# Patient Record
Sex: Male | Born: 1995 | Race: Black or African American | Hispanic: No | Marital: Single | Smoking: Current every day smoker
Health system: Southern US, Community
[De-identification: ages and names within clinical notes are randomized; demographics above are authoritative.]

## PROBLEM LIST (undated history)

## (undated) DIAGNOSIS — F259 Schizoaffective disorder, unspecified: Secondary | ICD-10-CM

---

## 2014-01-11 ENCOUNTER — Emergency Department (HOSPITAL_COMMUNITY): Payer: Self-pay

## 2014-01-11 ENCOUNTER — Emergency Department (HOSPITAL_COMMUNITY)
Admission: EM | Admit: 2014-01-11 | Discharge: 2014-01-11 | Disposition: A | Discharge status: Home / Self Care | Payer: Self-pay | Attending: Emergency Medicine | Admitting: Emergency Medicine

## 2014-01-11 ENCOUNTER — Encounter (HOSPITAL_COMMUNITY): Payer: Self-pay | Admitting: Emergency Medicine

## 2014-01-11 DIAGNOSIS — R0789 Other chest pain: Secondary | ICD-10-CM | POA: Insufficient documentation

## 2014-01-11 DIAGNOSIS — F17200 Nicotine dependence, unspecified, uncomplicated: Secondary | ICD-10-CM | POA: Insufficient documentation

## 2014-01-11 DIAGNOSIS — Z8659 Personal history of other mental and behavioral disorders: Secondary | ICD-10-CM | POA: Insufficient documentation

## 2014-01-11 DIAGNOSIS — R079 Chest pain, unspecified: Secondary | ICD-10-CM | POA: Insufficient documentation

## 2014-01-11 DIAGNOSIS — H5789 Other specified disorders of eye and adnexa: Secondary | ICD-10-CM | POA: Insufficient documentation

## 2014-01-11 HISTORY — DX: Schizoaffective disorder, unspecified: F25.9

## 2014-01-11 LAB — CBC WITH DIFFERENTIAL/PLATELET
Basophils Absolute: 0 10*3/uL (ref 0.0–0.1)
Basophils Relative: 0 % (ref 0–1)
Eosinophils Absolute: 0.2 10*3/uL (ref 0.0–0.7)
Eosinophils Relative: 2 % (ref 0–5)
HEMATOCRIT: 42 % (ref 39.0–52.0)
HEMOGLOBIN: 14.4 g/dL (ref 13.0–17.0)
Lymphocytes Relative: 15 % (ref 12–46)
Lymphs Abs: 1.5 10*3/uL (ref 0.7–4.0)
MCH: 27.3 pg (ref 26.0–34.0)
MCHC: 34.3 g/dL (ref 30.0–36.0)
MCV: 79.5 fL (ref 78.0–100.0)
MONO ABS: 0.6 10*3/uL (ref 0.1–1.0)
MONOS PCT: 6 % (ref 3–12)
NEUTROS ABS: 7.6 10*3/uL (ref 1.7–7.7)
Neutrophils Relative %: 77 % (ref 43–77)
Platelets: 191 10*3/uL (ref 150–400)
RBC: 5.28 MIL/uL (ref 4.22–5.81)
RDW: 14.3 % (ref 11.5–15.5)
WBC: 9.8 10*3/uL (ref 4.0–10.5)

## 2014-01-11 LAB — BASIC METABOLIC PANEL
Anion gap: 10 (ref 5–15)
BUN: 10 mg/dL (ref 6–23)
CO2: 27 meq/L (ref 19–32)
Calcium: 9 mg/dL (ref 8.4–10.5)
Chloride: 103 mEq/L (ref 96–112)
Creatinine, Ser: 0.78 mg/dL (ref 0.50–1.35)
GFR calc Af Amer: >90 mL/min (ref >90)
GLUCOSE: 93 mg/dL (ref 70–99)
POTASSIUM: 4.5 meq/L (ref 3.7–5.3)
Sodium: 140 mEq/L (ref 137–147)

## 2014-01-11 LAB — TROPONIN I: Troponin I: <0.3 ng/mL (ref <0.30)

## 2014-01-11 LAB — D-DIMER, QUANTITATIVE: D-Dimer, Quant: <0.27 ug/mL-FEU (ref 0.00–0.48)

## 2014-01-11 NOTE — ED Provider Notes (Signed)
CSN: [id number]     Arrival date & time 01/11/14  8657 History   First MD Initiated Contact with Patient 01/11/14 0847     Chief Complaint  Patient presents with  . Chest Pain  . Eye Drainage   (Consider location/radiation/quality/duration/timing/severity/associated sxs/prior Treatment) HPI Bobby Leon is a 18 yo male with PMH: schizoaffective disorder, presenting to the ED with report of an episode of chest pain appr 1 hr prior to arrival.  Pt states he was praying with co-workers before the beginning of his shift and felt dull ache in his anterior chest wall, it lasted about 10 min and resolved on its own. He rates the pain at the time as 8/10,  currently the pain is 2/10 and describes it as intermittent.  Nothing really makes the pain better or worse. He also states at the time he had some blurry vision, that resolved when the pain resolved.  Nothing seems to make the pain better or worse.  He endorse a long bus ride in the last 2 weeks, but denies recent surgery, pain or unilateral swelling in an extremity, cough, shortness of breath, headache, numbness, tingling or fever.    Past Medical History  Diagnosis Date  . Schizoaffective disorder    History reviewed. No pertinent past surgical history. No family history on file. History  Substance Use Topics  . Smoking status: Current Every Day Smoker  . Smokeless tobacco: Not on file  . Alcohol Use: No    Review of Systems  Constitutional: Negative for fever and chills.  HENT: Negative for sore throat.   Eyes: Positive for discharge. Negative for visual disturbance.  Respiratory: Negative for cough and shortness of breath.   Cardiovascular: Positive for chest pain. Negative for leg swelling.  Gastrointestinal: Negative for nausea, vomiting and diarrhea.  Genitourinary: Negative for dysuria.  Musculoskeletal: Negative for myalgias.  Skin: Negative for rash.  Neurological: Negative for weakness, numbness and headaches.     Allergies  Review of patient's allergies indicates not on file.  Home Medications   Prior to Admission medications   Not on File   BP 110/57  Pulse 66  Temp(Src) 97.4 F (36.3 C) (Oral)  Resp 16  Ht  (1.854 m)  Wt 197 lb (89.359 kg)  BMI 26.00 kg/m2  SpO2 100% Physical Exam  Nursing note and vitals reviewed. Constitutional: He appears well-developed and well-nourished. No distress.  HENT:  Head: Normocephalic and atraumatic.  Mouth/Throat: Oropharynx is clear and moist. No oropharyngeal exudate.  Eyes: Conjunctivae are normal.  Neck: Neck supple. No thyromegaly present.  Cardiovascular: Normal rate, regular rhythm and intact distal pulses.   Pulmonary/Chest: Effort normal and breath sounds normal. No respiratory distress. He has no wheezes. He has no rales. He exhibits no tenderness.  Abdominal: Soft. There is no tenderness.  Musculoskeletal: He exhibits no tenderness.  Lymphadenopathy:    He has no cervical adenopathy.  Neurological: He is alert.  Skin: Skin is warm and dry. No rash noted. He is not diaphoretic.  Psychiatric: He has a normal mood and affect.    ED Course  Procedures (including critical care time) Labs Review Labs Reviewed  CBC WITH DIFFERENTIAL  TROPONIN I  BASIC METABOLIC PANEL  D-DIMER, QUANTITATIVE  TROPONIN I    Imaging Review Dg Chest 2 View  01/11/2014   CLINICAL DATA:  Chest pain and shortness of breath.  EXAM: CHEST  2 VIEW  COMPARISON:  None.  FINDINGS: Cardiac leads project over the chest. The heart,  mediastinal, and hilar contours are normal. Pulmonary vascularity is normal. Lungs clear. Negative for pleural effusion or pneumothorax. Trachea is midline. Bones are intact.  IMPRESSION: No active cardiopulmonary disease.   Electronically Signed   By: Britta Mccreedy M.D.   On: 01/11/2014 11:49     EKG Interpretation   Date/Time:  Saturday January 11 2014 08:41:27 EDT Ventricular Rate:  69 PR Interval:  140 QRS Duration:  96 QT Interval:  394 QTC Calculation: 422 R Axis:   67 Text Interpretation:  Normal sinus rhythm Cannot rule out Anterior infarct  , age undetermined Abnormal ECG No significant change was found Confirmed  by Manus Gunning  MD, STEPHEN [id number]) on 01/11/2014 11:43:26 AM      MDM   Final diagnoses:  Chest pain, unspecified chest pain type   Episode of chest pain while praying, no radiation, no n/v sob. Also blurry vision during episode.  All symptoms now resolved.  Labs, CXR and visual acuity.  Visual acuity equal and normal.  PERC positive due to report of long bus ride 2 weeks ago.  CBC, BMP, Troponin and repeat Troponin, and D-dimer negative.  Pt sleeping without distress, no subsequent episodes of chest pain while in the department.  Pt appears safe to go home. Discharge instructions include symptomatic management, encouragement to follow up with PCP, and resources to establish a PCP.  Return precautions provided.    Filed Vitals:   01/11/14 1202 01/11/14 1245 01/11/14 1330 01/11/14 1405  BP: 108/55 117/50 121/58 121/58  Pulse: 61 59 65 74  Temp:    97.8 F (36.6 C)  TempSrc:    Oral  Resp: Height:      Weight:      SpO2: 99% 100% 100% 100%    Meds given in ED:  Medications - No data to display  Discharge Medication List as of 01/11/2014  1:55 PM        Harle Battiest, NP 01/14/14 1014

## 2014-01-11 NOTE — ED Notes (Signed)
Pt. Stated, i had a episode where my chest was hurting and felt weak and my eye is draining like i can't see

## 2014-01-11 NOTE — ED Notes (Signed)
Patient returned from X-ray 

## 2014-01-11 NOTE — Discharge Instructions (Signed)
Please follow the directions provided.  Be sure to follow up with your primary care provider regarding this episode of pain.    Your caregiver has diagnosed you as having chest pain that is not specific for one problem, but does not require admission.  You are at low risk for an acute heart condition or other serious illness. Chest pain comes from many different causes.   SEEK IMMEDIATE MEDICAL ATTENTION IF: You have severe chest pain, especially if the pain is crushing or pressure-like and spreads to the arms, back, neck, or jaw, or if you have sweating, nausea (feeling sick to your stomach), or shortness of breath. THIS IS AN EMERGENCY. Don't wait to see if the pain will go away. Get medical help at once. Call 911 or 0 (operator). DO NOT drive yourself to the hospital.  Your chest pain gets worse and does not go away with rest.  You have an attack of chest pain lasting longer than usual, despite rest and treatment with the medications your caregiver has prescribed.  You wake from sleep with chest pain or shortness of breath.  You feel dizzy or faint.  You have chest pain not typical of your usual pain for which you originally saw your caregiver.   Emergency Department Resource Guide 1) Find a Doctor and Pay Out of Pocket Although you won't have to find out who is covered by your insurance plan, it is a good idea to ask around and get recommendations. You will then need to call the office and see if the doctor you have chosen will accept you as a new patient and what types of options they offer for patients who are self-pay. Some doctors offer discounts or will set up payment plans for their patients who do not have insurance, but you will need to ask so you aren't surprised when you get to your appointment.  2) Contact Your Local Health Department Not all health departments have doctors that can see patients for sick visits, but many do, so it is worth a call to see if yours does. If you don't  know where your local health department is, you can check in your phone book. The CDC also has a tool to help you locate your state's health department, and many state websites also have listings of all of their local health departments.  3) Find a Walk-in Clinic If your illness is not likely to be very severe or complicated, you may want to try a walk in clinic. These are popping up all over the country in pharmacies, drugstores, and shopping centers. They're usually staffed by nurse practitioners or physician assistants that have been trained to treat common illnesses and complaints. They're usually fairly quick and inexpensive. However, if you have serious medical issues or chronic medical problems, these are probably not your best option.  No Primary Care Doctor: - Call Health Connect at  [phone] - they can help you locate a primary care doctor that  accepts your insurance, provides certain services, etc. - Physician Referral Service- [phone]  Chronic Pain Problems: Organization         Address  Phone   Notes  [facility]  [phone] Patients need to be referred by their primary care doctor.   Medication Assistance: Organization         Address  Phone   Notes  [facility] Medication [facility] [address] [place] [place], [place] [address] [phone] --Must be a  resident of [facility] -- Must have NO insurance coverage whatsoever (no Medicaid/ Medicare, etc.) -- The pt. MUST have a primary care doctor that directs their care regularly and follows them in the community   [facility]  [phone]   [employer]  [phone]    Agencies that provide inexpensive medical care: Organization         Address  Phone   Notes  [facility]  [phone]   [facility]    [phone]   [facility] [address] [place], [place] [address] [phone]   [facility] [address] [place], [place] [phone]   Planned Parenthood    [phone]   [facility]    [phone]   [facility]  [address], [place] Phone:  [phone], Fax:  [phone] Hours of Operation:  9 am - 6 pm, M-F.  Also accepts Medicaid/Medicare and self-pay.  [facility]  [address], [place] Phone: [phone], Fax: [phone]. Hours of Operation:  8:30 am - 5:30 pm, M-F.  Also accepts Medicaid and self-pay.  [facility] [place] [address], [place] Phone: [phone]   [facility] [address] Natasha Bence [place], [place] [phone], Ext. [phone] Mondays & Thursdays: 7-9 AM.  First 15 patients are seen on a first come, first serve basis.    Medicaid-accepting [facility] Providers:  Organization         Address  Phone   Notes  [facility] [address], [place] [phone] Also accepts self-pay patients.  [facility] [address], [place]  [phone]   [facility] [address], [place] [phone]   [facility] [address], [place] [phone]   Renaye Rakers [address], [place]   [phone] Only accepts [place] Access [place] patients after they have their name applied to their card.   Self-Pay (no insurance) in [facility]:  Organization         Address  Phone   Notes  Sickle Cell Patients, [facility] [address] [place], [place] [phone]   [facility] Urgent Care [address] [place], [place] [phone]   [facility] [place]  [address], [place] [phone]   [facility]/Dr. Osei-Bonsu  [address] [place] or [address], [place] [phone] Phone  number for both [place] and [place] locations is the same.  [facility] [address] [place] [phone]   [facility] [address], [place] or [address] [phone]   [facility] [address] [place] [phone], phone; [phone], fax Sees patients 1st and 3rd Saturday of every month.  Must not qualify for public or private insurance (i.e. Medicaid, Medicare, [place] Health Choice, Veterans' Benefits)  Household income should be no more than 200% of the poverty level The clinic cannot treat you if you are pregnant or think you are pregnant  Sexually transmitted diseases are not treated at the clinic.    Dental Care: Organization  Address  Phone  Notes  [facility] [address] [place], [place] [phone] Accepts children up to age 4 who are enrolled in [place] or [place] Health Choice; pregnant women with a Medicaid card; and children who have applied for Medicaid or [place] Health Choice, but were declined, whose parents can pay a reduced fee at time of service.  [facility]  [address], [place] [phone] Accepts children up to age 7 who are enrolled in [place] or [place] Health Choice; pregnant women with a Medicaid card; and children who have applied for Medicaid or [place] Health Choice, but were declined, whose parents can pay a reduced fee at time of service.  [facility]  [address] [place], [place] [phone] Patients are seen by appointment only. Walk-ins are not accepted. [facility] will see patients 110 years of age and older. Monday - Tuesday (8am-5pm) Most Wednesdays (8:30-5pm) $30 per visit, cash only  [facility]  [address], [facility] [phone] Patients are seen by appointment only.  Walk-ins are not accepted. [facility] will see patients 78 years of age and older. One Wednesday Evening (Monthly: Volunteer Based).  $30 per visit, cash only  [employer]  [phone] for adults; Children under age 51, call Graduate Pediatric Dentistry at [phone]. Children aged 36-14, please call [phone] to request a pediatric application.  Dental services are provided in all areas of dental care including fillings, crowns and bridges, complete and partial dentures, implants, gum treatment, root canals, and extractions. Preventive care is also provided. Treatment is provided to both adults and children. Patients are selected via a lottery and there is often a waiting list.   [facility] [address] [place]  [phone] [email or web address]   [facility] [address] [place], [place] [phone], Ext. [phone] Second and Fourth Thursday of each month, opens at 6:30 AM; Clinic ends at 9 AM.  Patients are seen on a first-come first-served basis, and a limited number are seen during each clinic.   [facility]  [address] [place], [place] [phone]   Eligibility Requirements You must have lived in [place], [place], or [place] counties for at least the last three months.   You cannot be eligible for state or federal sponsored [place], including [employer], [place], or [employer].   You generally cannot be eligible for healthcare insurance through your employer.    How to apply: Eligibility screenings are held every Tuesday and Wednesday afternoon from 1:00 pm until 4:00 pm. You do not need an appointment for the interview!  [facility] [address] [place], [place] [phone]   [facility] Health Department  [phone]   [facility] Health Department  [phone]   [facility] Health Department  [phone]    Behavioral Health  Resources in the Community: Intensive Outpatient Programs Organization         Address  Phone  Notes  [facility] [address] [place], [place] [phone]   [facility] Outpatient [address] [place], [place] [phone]   [facility] [address] [place], [place]  [phone]   [facility] Mental Health [address]  [place], [place] [phone] or [phone]   Substance Abuse Resources  Organization         Address  Phone  Notes  [facility]  [phone]   [facility]  [phone]   [facility] [place]  [phone]   Floydene Flock  [phone]   Residential & Outpatient Substance Abuse Program  [phone]   Psychological Services Organization         Address  Phone  Notes  [facility]  [phone]   [facility]  [phone]   [facility] Mental Health [address], [place] [phone] or [phone]    Mobile Crisis Teams Organization         Address  Phone  Notes  [facility], Mobile Crisis Care Unit  [phone]   [facility]  [address] [place], [place] [phone]   Doristine Locks [address] [place] [phone]    Self-Help/Support Groups Organization         Address  Phone             Notes  [facility] [place] - variety of support groups  336- I7437963 Call for more information  Narcotics Anonymous (NA), [facility] [address], [employer] [place]  2 meetings at this location   Statistician         Address  Phone  Notes  [facility] [address],    [place]  [phone]   [facility]  [address] [place] [address], [place], [place] [phone]   [facility] [address] [place], [place] [phone] Admissions: 8am-3pm M-F  [facility] [address],    [place], [place] [phone]   [facility] [address] [place], [place], [place] [phone]   [facility] [address]  [place], [place] [phone]   [facility] - Intensive Outpatient [address], [place], [place] [phone]   [facility] ([facility] [address] [place]  [place], [place] [phone] or [phone]   [facility] ([facility]) [address], [place], [place] [phone] Accepts Medicaid  [facility] [address]  [place] [phone] Substance Abuse/Addiction Treatment   [facility] Organization         Address  Phone  Notes  [facility]  [phone]   Angie Fava, PhD [address] Ervin Knack [place], [place]   [phone] or [phone]   [facility]al   [address] [place], [place] [phone]   [facility] [address] [place], [place] [phone] Insurance/Medicaid/sponsorship through [facility] and Families [address]                                    [place], [place] [phone] Therapy/tele-psych/case  [facility] [address], [place] [phone]    Dr. Lolly Mustache  [phone]   [facility] [place]  [employer] [facility] Dept. 1) [address], [place] 2) [address], [place] 3)  [address], [place] [phone]  [phone]   [facility] Child Abuse Hotline [phone] or [phone] (After Hours)

## 2014-01-11 NOTE — ED Notes (Signed)
Patient transported to X-ray 

## 2014-01-14 NOTE — ED Provider Notes (Signed)
Medical screening examination/treatment/procedure(s) were conducted as a shared visit with non-physician practitioner(s) and myself.  I personally evaluated the patient during the encounter.  Conflicting history. Denies CP to me. States had SOB 1 hour ago, now resolved.  Denies any visual changes to me.  CTAB, RRR. EKG nsr. Exam benign.  D-dimer given recent bus trip.   EKG Interpretation   Date/Time:  Saturday January 11 2014 08:41:27 EDT Ventricular Rate:  69 PR Interval:  140 QRS Duration: 96 QT Interval:  394 QTC Calculation: 422 R Axis:   67 Text Interpretation:  Normal sinus rhythm Cannot rule out Anterior infarct  , age undetermined Abnormal ECG No significant change was found Confirmed  by Manus Gunning  MD, Tempie Gibeault [phone]) on 01/11/2014 11:43:26 AM       Glynn Octave, MD 01/14/14 1104

## 2014-02-26 ENCOUNTER — Encounter (HOSPITAL_COMMUNITY): Payer: Self-pay | Admitting: Emergency Medicine

## 2014-02-26 ENCOUNTER — Emergency Department (HOSPITAL_COMMUNITY): Payer: Self-pay

## 2014-02-26 ENCOUNTER — Emergency Department (HOSPITAL_COMMUNITY)
Admission: EM | Admit: 2014-02-26 | Discharge: 2014-02-27 | Disposition: A | Discharge status: Home / Self Care | Payer: Self-pay | Attending: Emergency Medicine | Admitting: Emergency Medicine

## 2014-02-26 DIAGNOSIS — F1910 Other psychoactive substance abuse, uncomplicated: Secondary | ICD-10-CM

## 2014-02-26 DIAGNOSIS — T483X1A Poisoning by antitussives, accidental (unintentional), initial encounter: Secondary | ICD-10-CM

## 2014-02-26 DIAGNOSIS — R4182 Altered mental status, unspecified: Secondary | ICD-10-CM | POA: Insufficient documentation

## 2014-02-26 DIAGNOSIS — Z720 Tobacco use: Secondary | ICD-10-CM | POA: Insufficient documentation

## 2014-02-26 LAB — ETHANOL: Alcohol, Ethyl (B): <11 mg/dL (ref 0–11)

## 2014-02-26 LAB — CBC WITH DIFFERENTIAL/PLATELET
Basophils Absolute: 0 10*3/uL (ref 0.0–0.1)
Basophils Relative: 0 % (ref 0–1)
EOS ABS: 0 10*3/uL (ref 0.0–0.7)
Eosinophils Relative: 0 % (ref 0–5)
HEMATOCRIT: 44.6 % (ref 39.0–52.0)
Hemoglobin: 15 g/dL (ref 13.0–17.0)
LYMPHS ABS: 1.1 10*3/uL (ref 0.7–4.0)
LYMPHS PCT: 6 % — AB (ref 12–46)
MCH: 27.2 pg (ref 26.0–34.0)
MCHC: 33.6 g/dL (ref 30.0–36.0)
MCV: 80.8 fL (ref 78.0–100.0)
MONOS PCT: 6 % (ref 3–12)
Monocytes Absolute: 1.2 10*3/uL — ABNORMAL HIGH (ref 0.1–1.0)
Neutro Abs: 16.7 10*3/uL — ABNORMAL HIGH (ref 1.7–7.7)
Neutrophils Relative %: 88 % — ABNORMAL HIGH (ref 43–77)
Platelets: 220 10*3/uL (ref 150–400)
RBC: 5.52 MIL/uL (ref 4.22–5.81)
RDW: 13.7 % (ref 11.5–15.5)
WBC: 19 10*3/uL — ABNORMAL HIGH (ref 4.0–10.5)

## 2014-02-26 LAB — COMPREHENSIVE METABOLIC PANEL
ALT: 47 U/L (ref 0–53)
ANION GAP: 12 (ref 5–15)
AST: 67 U/L — AB (ref 0–37)
Albumin: 4.5 g/dL (ref 3.5–5.2)
Alkaline Phosphatase: 70 U/L (ref 39–117)
BUN: 11 mg/dL (ref 6–23)
CO2: 26 meq/L (ref 19–32)
CREATININE: 1.07 mg/dL (ref 0.50–1.35)
Calcium: 9.8 mg/dL (ref 8.4–10.5)
Chloride: 98 mEq/L (ref 96–112)
GFR calc Af Amer: >90 mL/min (ref >90)
Glucose, Bld: 114 mg/dL — ABNORMAL HIGH (ref 70–99)
POTASSIUM: 5 meq/L (ref 3.7–5.3)
Sodium: 136 mEq/L — ABNORMAL LOW (ref 137–147)
Total Bilirubin: 0.9 mg/dL (ref 0.3–1.2)
Total Protein: 7.9 g/dL (ref 6.0–8.3)

## 2014-02-26 LAB — RAPID URINE DRUG SCREEN, HOSP PERFORMED
AMPHETAMINES: NOT DETECTED
BENZODIAZEPINES: NOT DETECTED
Barbiturates: NOT DETECTED
COCAINE: NOT DETECTED
Opiates: NOT DETECTED
Tetrahydrocannabinol: NOT DETECTED

## 2014-02-26 LAB — PROTIME-INR
INR: 1.11 (ref 0.00–1.49)
Prothrombin Time: 14.4 seconds (ref 11.6–15.2)

## 2014-02-26 LAB — ACETAMINOPHEN LEVEL: Acetaminophen (Tylenol), Serum: <15 ug/mL (ref 10–30)

## 2014-02-26 LAB — SALICYLATE LEVEL: Salicylate Lvl: <2 mg/dL — ABNORMAL LOW (ref 2.8–20.0)

## 2014-02-26 MED ORDER — NICOTINE 21 MG/24HR TD PT24: 21.0000 mg | MEDICATED_PATCH | Freq: Every day | TRANSDERMAL | Status: DC | PRN

## 2014-02-26 MED ORDER — ALUM & MAG HYDROXIDE-SIMETH 200-200-20 MG/5ML PO SUSP: 30.0000 mL | ORAL | Status: DC | PRN

## 2014-02-26 MED ORDER — ACETAMINOPHEN 325 MG PO TABS: 650.0000 mg | ORAL_TABLET | ORAL | Status: DC | PRN

## 2014-02-26 MED ORDER — SODIUM CHLORIDE 0.9 % IV SOLN: INTRAVENOUS | Status: DC

## 2014-02-26 MED ORDER — SODIUM CHLORIDE 0.9 % IV BOLUS (SEPSIS)
1000.0000 mL | Freq: Once | INTRAVENOUS | Status: AC
  Administered 2014-02-26: 1000 mL via INTRAVENOUS

## 2014-02-26 MED ORDER — IBUPROFEN 200 MG PO TABS: 400.0000 mg | ORAL_TABLET | Freq: Three times a day (TID) | ORAL | Status: DC | PRN

## 2014-02-26 NOTE — ED Notes (Signed)
Patient transported to X-ray 

## 2014-02-26 NOTE — ED Notes (Signed)
Pt reports that he took 3 bottles of delsym and one pack of cough and cold congestion medication. Pt reports that he does not know how many pills were in each bottle. Pt denies SI/HI but says that he did this because he just wanted to feel something.
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
## 2014-02-26 NOTE — ED Notes (Signed)
Bed: WA22 Expected date:  Expected time:  Means of arrival:  Comments: 

## 2014-02-26 NOTE — ED Notes (Signed)
Pt's friend/family contact info   [facility] - [phone] Gildardo CrankerWillie Wooten- [phone]

## 2014-02-26 NOTE — ED Notes (Signed)
Pt given urinal and made aware of need for urine specimen 
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
## 2014-02-27 ENCOUNTER — Encounter (HOSPITAL_COMMUNITY): Payer: Self-pay | Admitting: Registered Nurse

## 2014-02-27 DIAGNOSIS — F1994 Other psychoactive substance use, unspecified with psychoactive substance-induced mood disorder: Secondary | ICD-10-CM

## 2014-02-27 DIAGNOSIS — T483X1A Poisoning by antitussives, accidental (unintentional), initial encounter: Secondary | ICD-10-CM | POA: Diagnosis present

## 2014-02-27 NOTE — ED Notes (Signed)
Patient refused vitals.

## 2014-02-27 NOTE — ED Notes (Signed)
Patient has calm and cooperative during the shift. Patient has interacted with peers and staff appropriately. Encouragement and support provided and safety maintain.
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
## 2014-02-27 NOTE — Consult Note (Signed)
Face to face evaluation and I agree with this note 

## 2014-02-27 NOTE — BH Assessment (Signed)
Dr. Ladona Ridgelaylor and Assunta FoundShuvon Rankin, NP evaluated patient this am and recommended discharge. Patient to discharge back to his current residence [facility] Writer confirmed with Interior and spatial designerdirector [facility]Darryl" that patient is ok to return. Dar[facility] Writer made nursing staff aware of discharge plans.

## 2014-02-27 NOTE — Discharge Instructions (Signed)
Chemical Dependency  Chemical dependency is an addiction to drugs or alcohol. It is characterized by the repeated behavior of seeking out and using drugs and alcohol despite harmful consequences to the health and safety of ones self and others.   RISK FACTORS  There are certain situations or behaviors that increase a person's risk for chemical dependency. These include:  · A family history of chemical dependency.  · A history of mental health issues, including depression and anxiety.  · A home environment where drugs and alcohol are easily available to you.  · Drug or alcohol use at a young age.  SYMPTOMS   The following symptoms can indicate chemical dependency:  · Inability to limit the use of drugs or alcohol.  · Nausea, sweating, shakiness, and anxiety that occurs when alcohol or drugs are not being used.  · An increase in amount of drugs or alcohol that is necessary to get drunk or high.  People who experience these symptoms can assess their use of drugs and alcohol by asking themselves the following questions:  · Have you been told by friends or family that they are worried about your use of alcohol or drugs?  · Do friends and family ever tell you about things you did while drinking alcohol or using drugs that you do not remember?  · Do you lie about using alcohol or drugs or about the amounts you use?  · Do you have difficulty completing daily tasks unless you use alcohol or drugs?  · Is the level of your work or school performance lower because of your drug or alcohol use?  · Do you get sick from using drugs or alcohol but keep using anyway?  · Do you feel uncomfortable in social situations unless you use alcohol or drugs?  · Do you use drugs or alcohol to help forget problems?   An answer of yes to any of these questions may indicate chemical dependency. Professional evaluation is suggested.  Document Released: 04/12/2001 Document Revised: 07/11/2011 Document Reviewed: 06/24/2010  ExitCare® Patient  Information ©2015 [employer]. This information is not intended to replace advice given to you by your health care provider. Make sure you discuss any questions you have with your health care provider.

## 2014-02-27 NOTE — Progress Notes (Signed)
[facility] Stacy,  ° °Provided pt with a list of primary care resources and a [facility] Orange Card application to help patient establish primary care.  °

## 2015-07-02 IMAGING — CR DG CHEST 2V
2 series · 2 of 2 positions shown · non-contrast
Comparison: 01/11/2014

CLINICAL DATA: Nonsmoker, cough, congestion, shortness of breath

EXAM:
CHEST  2 VIEW

[w chest pa]
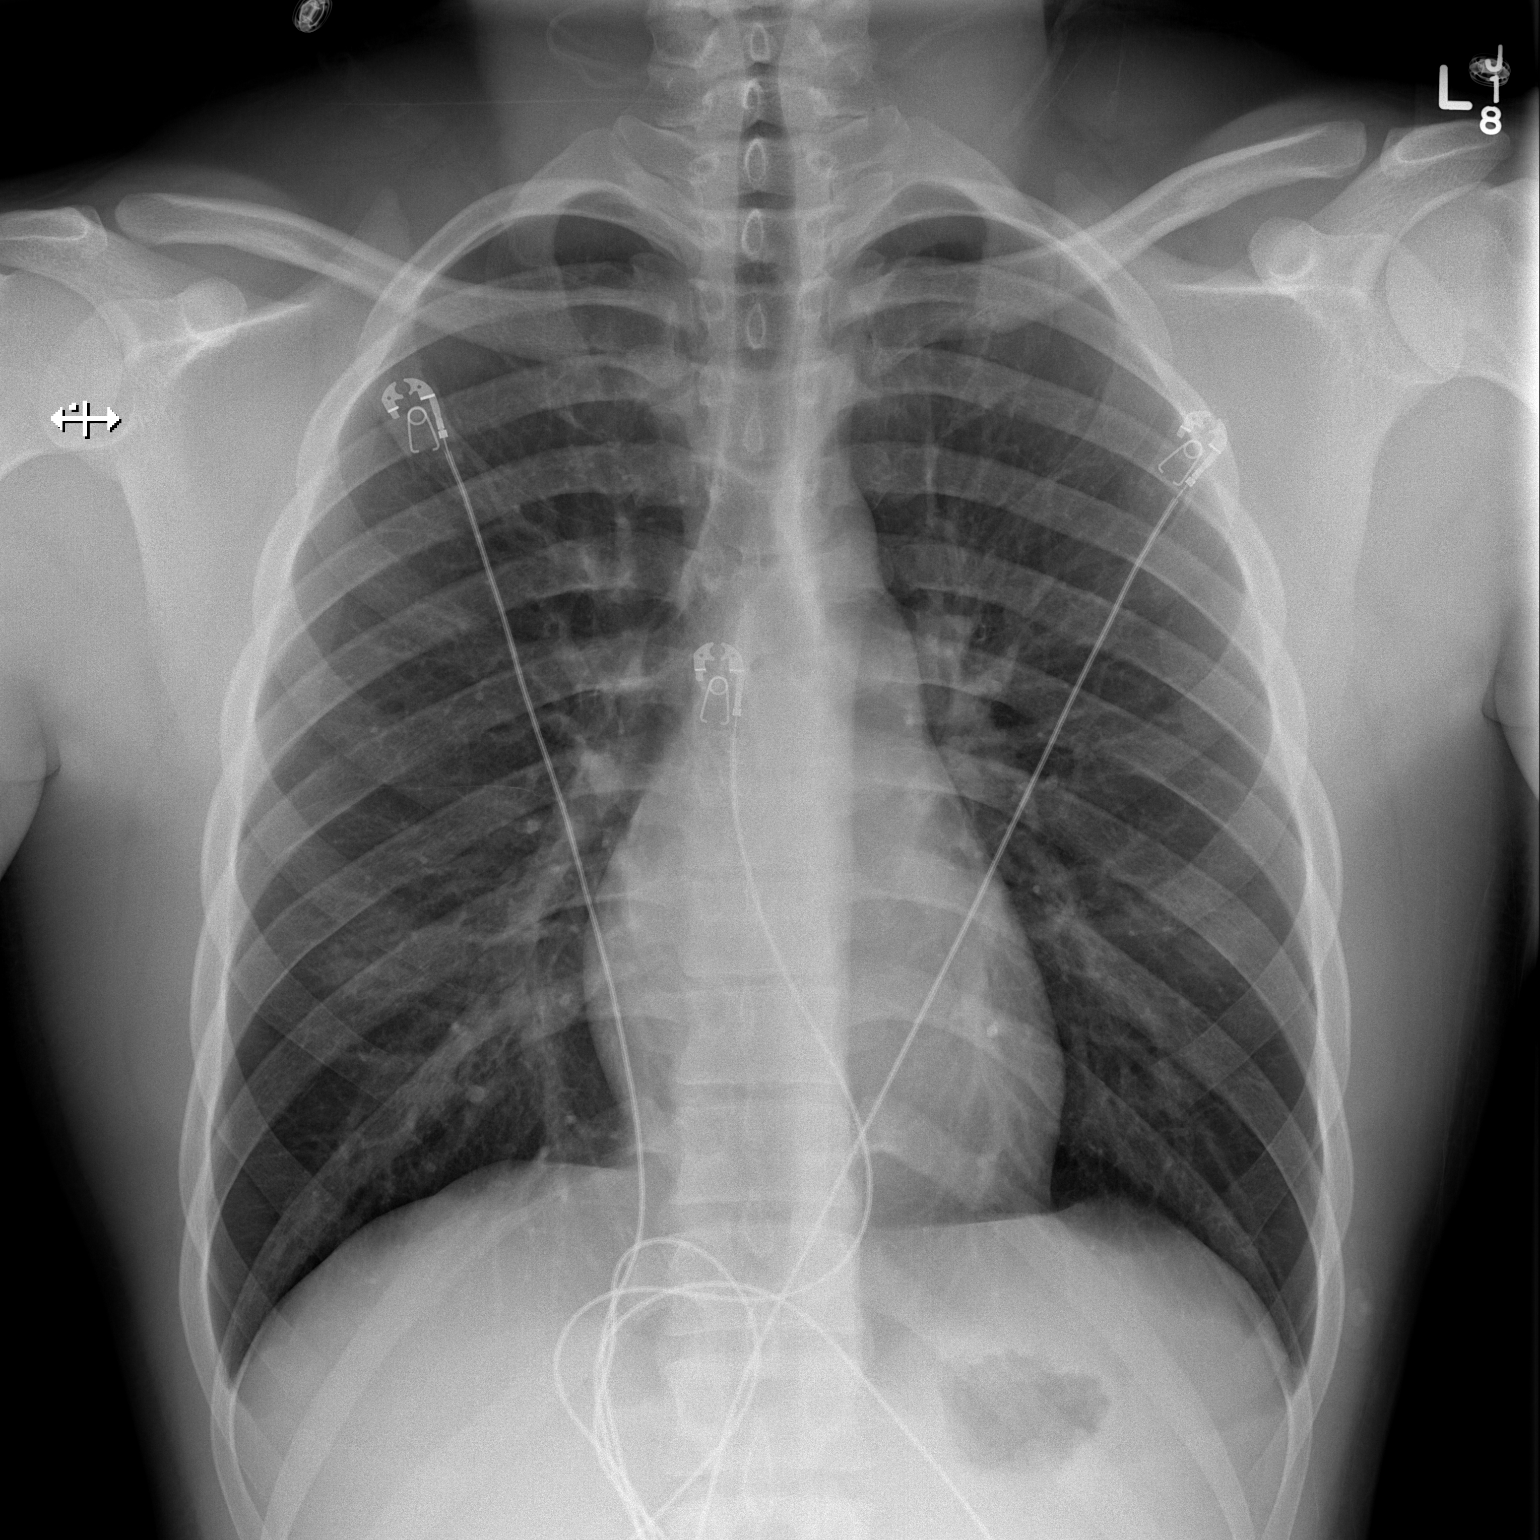

[w chest lat]
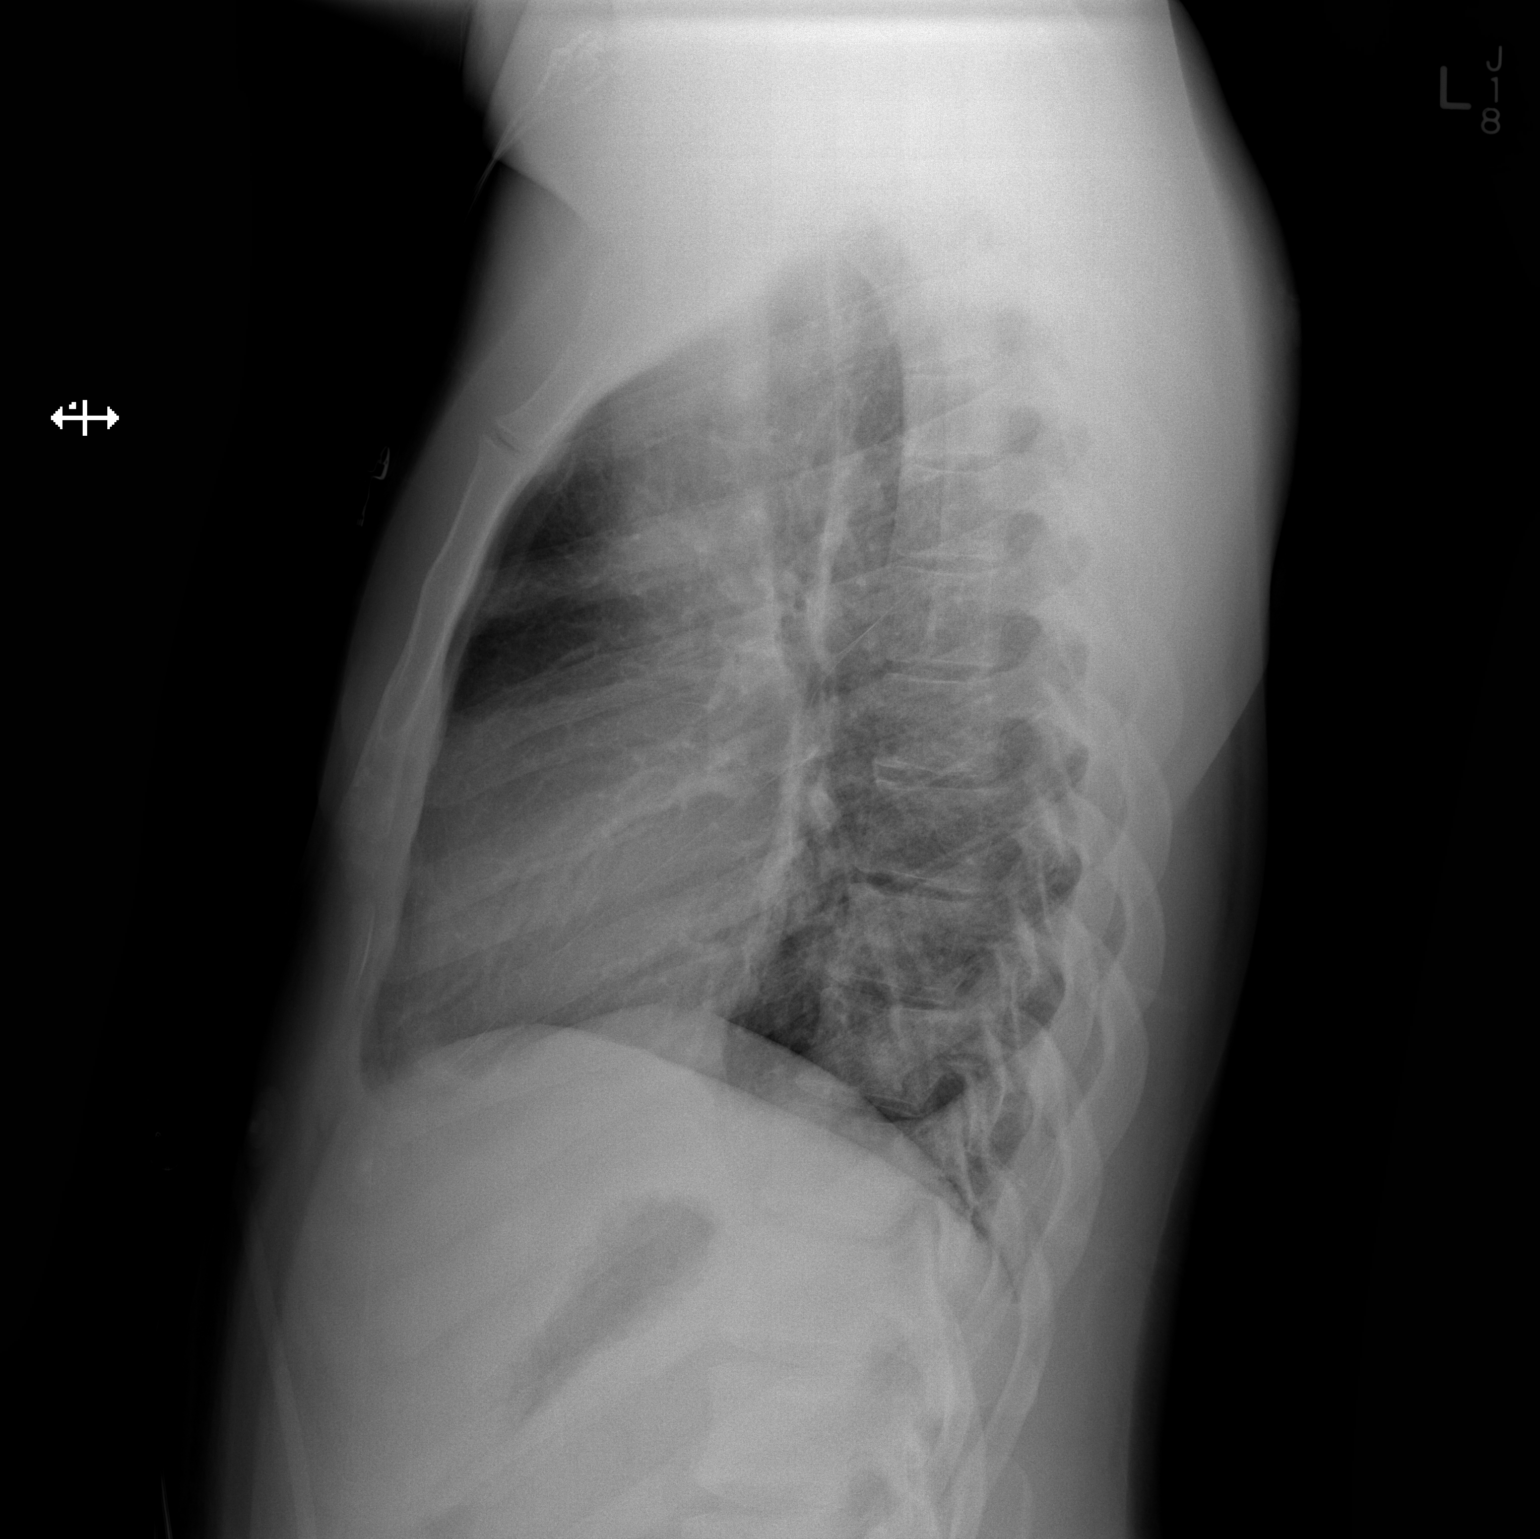

[2 of 2 positions shown; findings below may reference images not displayed]

FINDINGS: The heart size and mediastinal contours are within normal limits.
Both lungs are clear. The visualized skeletal structures are
unremarkable.
IMPRESSION: No active cardiopulmonary disease.
